# Patient Record
Sex: Male | Born: 1985 | Race: White | Hispanic: No | Marital: Single | Smoking: Current every day smoker
Health system: Southern US, Community
[De-identification: ages and names within clinical notes are randomized; demographics above are authoritative.]

## PROBLEM LIST (undated history)

## (undated) DIAGNOSIS — F419 Anxiety disorder, unspecified: Secondary | ICD-10-CM

## (undated) HISTORY — PX: TONSILLECTOMY: SUR1361

## (undated) HISTORY — DX: Anxiety disorder, unspecified: F41.9

---

## 2014-02-19 ENCOUNTER — Emergency Department: Payer: Self-pay | Admitting: Emergency Medicine

## 2014-02-19 LAB — CBC
HCT: 46.4 % (ref 40.0–52.0)
HGB: 15.5 g/dL (ref 13.0–18.0)
MCH: 29.3 pg (ref 26.0–34.0)
MCHC: 33.4 g/dL (ref 32.0–36.0)
MCV: 88 fL (ref 80–100)
Platelet: 228 10*3/uL (ref 150–440)
RBC: 5.29 10*6/uL (ref 4.40–5.90)
RDW: 13.4 % (ref 11.5–14.5)
WBC: 8.9 10*3/uL (ref 3.8–10.6)

## 2014-02-19 LAB — COMPREHENSIVE METABOLIC PANEL
ALT: 24 U/L
ANION GAP: 5 — AB (ref 7–16)
Albumin: 4.2 g/dL (ref 3.4–5.0)
Alkaline Phosphatase: 89 U/L
BUN: 18 mg/dL (ref 7–18)
Bilirubin,Total: 1.1 mg/dL — ABNORMAL HIGH (ref 0.2–1.0)
CREATININE: 1.23 mg/dL (ref 0.60–1.30)
Calcium, Total: 8.9 mg/dL (ref 8.5–10.1)
Chloride: 108 mmol/L — ABNORMAL HIGH (ref 98–107)
Co2: 27 mmol/L (ref 21–32)
EGFR (African American): >60
EGFR (Non-African Amer.): >60
Glucose: 102 mg/dL — ABNORMAL HIGH (ref 65–99)
Osmolality: 281 (ref 275–301)
Potassium: 3.8 mmol/L (ref 3.5–5.1)
SGOT(AST): 26 U/L (ref 15–37)
Sodium: 140 mmol/L (ref 136–145)
Total Protein: 7.3 g/dL (ref 6.4–8.2)

## 2014-02-19 LAB — URINALYSIS, COMPLETE
BILIRUBIN, UR: NEGATIVE
Bacteria: NONE SEEN
Blood: NEGATIVE
GLUCOSE, UR: NEGATIVE mg/dL (ref 0–75)
Leukocyte Esterase: NEGATIVE
NITRITE: NEGATIVE
Ph: 6 (ref 4.5–8.0)
Protein: 30
RBC,UR: 1 /HPF (ref 0–5)
Specific Gravity: 1.029 (ref 1.003–1.030)
Squamous Epithelial: <1
WBC UR: 1 /HPF (ref 0–5)
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
## 2019-10-26 ENCOUNTER — Ambulatory Visit (INDEPENDENT_AMBULATORY_CARE_PROVIDER_SITE_OTHER): Payer: 59 | Admitting: Physician Assistant

## 2019-10-26 ENCOUNTER — Encounter: Payer: Self-pay | Admitting: Physician Assistant

## 2019-10-26 ENCOUNTER — Other Ambulatory Visit: Payer: Self-pay

## 2019-10-26 VITALS — Ht 70.0 in

## 2019-10-26 DIAGNOSIS — F321 Major depressive disorder, single episode, moderate: Secondary | ICD-10-CM | POA: Diagnosis not present

## 2019-10-26 MED ORDER — CITALOPRAM HYDROBROMIDE 20 MG PO TABS: 20.0000 mg | ORAL_TABLET | Freq: Every day | ORAL | 1 refills | Status: DC

## 2019-10-26 NOTE — Patient Instructions (Signed)
Citalopram tablets What is this medicine? CITALOPRAM (sye TAL oh pram) is a medicine for depression. This medicine may be used for other purposes; ask your health care provider or pharmacist if you have questions. COMMON BRAND NAME(S): Celexa What should I tell my health care provider before I take this medicine? They need to know if you have any of these conditions:  bleeding disorders  bipolar disorder or a family history of bipolar disorder  glaucoma  heart disease  history of irregular heartbeat  kidney disease  liver disease  low levels of magnesium or potassium in the blood  receiving electroconvulsive therapy  seizures  suicidal thoughts, plans, or attempt; a previous suicide attempt by you or a family member  take medicines that treat or prevent blood clots  thyroid disease  an unusual or allergic reaction to citalopram, escitalopram, other medicines, foods, dyes, or preservatives  pregnant or trying to become pregnant  breast-feeding How should I use this medicine? Take this medicine by mouth with a glass of water. Follow the directions on the prescription label. You can take it with or without food. Take your medicine at regular intervals. Do not take your medicine more often than directed. Do not stop taking this medicine suddenly except upon the advice of your doctor. Stopping this medicine too quickly may cause serious side effects or your condition may worsen. A special MedGuide will be given to you by the pharmacist with each prescription and refill. Be sure to read this information carefully each time. Talk to your pediatrician regarding the use of this medicine in children. Special care may be needed. Patients over 60 years old may have a stronger reaction and need a smaller dose. Overdosage: If you think you have taken too much of this medicine contact a poison control center or emergency room at once. NOTE: This medicine is only for you. Do not share  this medicine with others. What if I miss a dose? If you miss a dose, take it as soon as you can. If it is almost time for your next dose, take only that dose. Do not take double or extra doses. What may interact with this medicine? Do not take this medicine with any of the following medications:  certain medicines for fungal infections like fluconazole, itraconazole, ketoconazole, posaconazole, voriconazole  cisapride  dronedarone  escitalopram  linezolid  MAOIs like Carbex, Eldepryl, Marplan, Nardil, and Parnate  methylene blue (injected into a vein)  pimozide  thioridazine This medicine may also interact with the following medications:  alcohol  amphetamines  aspirin and aspirin-like medicines  carbamazepine  certain medicines for depression, anxiety, or psychotic disturbances  certain medicines for infections like chloroquine, clarithromycin, erythromycin, furazolidone, isoniazid, pentamidine  certain medicines for migraine headaches like almotriptan, eletriptan, frovatriptan, naratriptan, rizatriptan, sumatriptan, zolmitriptan  certain medicines for sleep  certain medicines that treat or prevent blood clots like dalteparin, enoxaparin, warfarin  cimetidine  diuretics  dofetilide  fentanyl  lithium  methadone  metoprolol  NSAIDs, medicines for pain and inflammation, like ibuprofen or naproxen  omeprazole  other medicines that prolong the QT interval (cause an abnormal heart rhythm)  procarbazine  rasagiline  supplements like St. John's wort, kava kava, valerian  tramadol  tryptophan  ziprasidone This list may not describe all possible interactions. Give your health care provider a list of all the medicines, herbs, non-prescription drugs, or dietary supplements you use. Also tell them if you smoke, drink alcohol, or use illegal drugs. Some items may interact with   your medicine. What should I watch for while using this medicine? Tell your  doctor if your symptoms do not get better or if they get worse. Visit your doctor or health care professional for regular checks on your progress. Because it may take several weeks to see the full effects of this medicine, it is important to continue your treatment as prescribed by your doctor. Patients and their families should watch out for new or worsening thoughts of suicide or depression. Also watch out for sudden changes in feelings such as feeling anxious, agitated, panicky, irritable, hostile, aggressive, impulsive, severely restless, overly excited and hyperactive, or not being able to sleep. If this happens, especially at the beginning of treatment or after a change in dose, call your health care professional. You may get drowsy or dizzy. Do not drive, use machinery, or do anything that needs mental alertness until you know how this medicine affects you. Do not stand or sit up quickly, especially if you are an older patient. This reduces the risk of dizzy or fainting spells. Alcohol may interfere with the effect of this medicine. Avoid alcoholic drinks. Your mouth may get dry. Chewing sugarless gum or sucking hard candy, and drinking plenty of water will help. Contact your doctor if the problem does not go away or is severe. What side effects may I notice from receiving this medicine? Side effects that you should report to your doctor or health care professional as soon as possible:  allergic reactions like skin rash, itching or hives, swelling of the face, lips, or tongue  anxious  black, tarry stools  breathing problems  changes in vision  chest pain  confusion  elevated mood, decreased need for sleep, racing thoughts, impulsive behavior  eye pain  fast, irregular heartbeat  feeling faint or lightheaded, falls  feeling agitated, angry, or irritable  hallucination, loss of contact with reality  loss of balance or coordination  loss of memory  painful or prolonged  erections  restlessness, pacing, inability to keep still  seizures  stiff muscles  suicidal thoughts or other mood changes  trouble sleeping  unusual bleeding or bruising  unusually weak or tired  vomiting Side effects that usually do not require medical attention (report to your doctor or health care professional if they continue or are bothersome):  change in appetite or weight  change in sex drive or performance  dizziness  headache  increased sweating  indigestion, nausea  tremors This list may not describe all possible side effects. Call your doctor for medical advice about side effects. You may report side effects to FDA at 1-800-FDA-1088. Where should I keep my medicine? Keep out of reach of children. Store at room temperature between 15 and 30 degrees C (59 and 86 degrees F). Throw away any unused medicine after the expiration date. NOTE: This sheet is a summary. It may not cover all possible information. If you have questions about this medicine, talk to your doctor, pharmacist, or health care provider.  2020 Elsevier/Gold Standard (2018-05-30 09:05:36)  

## 2019-10-30 ENCOUNTER — Encounter: Payer: Self-pay | Admitting: Physician Assistant

## 2019-11-23 ENCOUNTER — Encounter: Payer: Self-pay | Admitting: Physician Assistant

## 2019-11-23 ENCOUNTER — Ambulatory Visit: Payer: Self-pay | Admitting: Physician Assistant

## 2019-11-23 DIAGNOSIS — F321 Major depressive disorder, single episode, moderate: Secondary | ICD-10-CM

## 2019-11-23 MED ORDER — CITALOPRAM HYDROBROMIDE 20 MG PO TABS: 20.0000 mg | ORAL_TABLET | Freq: Every day | ORAL | 1 refills | Status: DC

## 2020-04-22 ENCOUNTER — Encounter: Payer: Self-pay | Admitting: *Deleted

## 2020-04-22 ENCOUNTER — Emergency Department: Payer: Self-pay

## 2020-04-22 ENCOUNTER — Other Ambulatory Visit: Payer: Self-pay

## 2020-04-22 ENCOUNTER — Emergency Department
Admission: EM | Admit: 2020-04-22 | Discharge: 2020-04-22 | Disposition: A | Discharge status: Home / Self Care | Payer: Self-pay | Attending: Emergency Medicine | Admitting: Emergency Medicine

## 2020-04-22 DIAGNOSIS — X501XXA Overexertion from prolonged static or awkward postures, initial encounter: Secondary | ICD-10-CM | POA: Insufficient documentation

## 2020-04-22 DIAGNOSIS — F17210 Nicotine dependence, cigarettes, uncomplicated: Secondary | ICD-10-CM | POA: Insufficient documentation

## 2020-04-22 DIAGNOSIS — Z79899 Other long term (current) drug therapy: Secondary | ICD-10-CM | POA: Insufficient documentation

## 2020-04-22 DIAGNOSIS — S8992XA Unspecified injury of left lower leg, initial encounter: Secondary | ICD-10-CM | POA: Insufficient documentation

## 2020-04-22 NOTE — Discharge Instructions (Addendum)
There is no fracture on your knee x-ray.  Please use knee brace and ice and elevate knee.  Continue using crutches.  Please call orthopedics tomorrow for a follow-up appointment.

## 2020-04-22 NOTE — ED Provider Notes (Signed)
[facility] Emergency Department Provider Note  ____________________________________________  Time seen: Approximately 5:44 PM  I have reviewed the triage vital signs and the nursing notes.   HISTORY  Chief Complaint Knee Pain    HPI Danny Dixon is a 34 y.o. male that presents to the emergency department for evaluation of left knee pain after an injury 2 days ago.  Patient states that he stepped wrong and twisted his knee and felt a pop.  He is having pain to the inside of his knee currently.  He is able to walk but it is mildly painful.  He has been using crutches and taking ibuprofen.  No other injuries.   No past medical history on file.  Patient Active Problem List   Diagnosis Date Noted  . Depression, major, single episode, moderate (HCC) 10/26/2019    Past Surgical History:  Procedure Laterality Date  . TONSILLECTOMY      Prior to Admission medications   Medication Sig Start Date End Date Taking? Authorizing Provider  citalopram (CELEXA) 20 MG tablet Take 1 tablet (20 mg total) by mouth daily. 11/23/19   Margaretann Loveless, PA-C    Allergies Patient has no known allergies.  Family History  Problem Relation Age of Onset  . Multiple sclerosis Mother   . Healthy Father   . Healthy Sister   . Autism Son   . ADD / ADHD Son   . Diabetes Maternal Grandfather   . Hypertension Maternal Grandfather     Social History Social History   Tobacco Use  . Smoking status: Current Every Day Smoker    Packs/day: 0.50    Years: 10.00    Pack years: 5.00    Types: Cigarettes  . Smokeless tobacco: Never Used  Vaping Use  . Vaping Use: Never used  Substance Use Topics  . Alcohol use: Yes  . Drug use: Never     Review of Systems  Cardiovascular: No chest pain. Respiratory: No SOB. Gastrointestinal: No abdominal pain.  No nausea, no vomiting.  Musculoskeletal: Positive for knee pain. Skin: Negative for rash, abrasions, lacerations,  ecchymosis. Neurological: Negative for headaches  ____________________________________________   PHYSICAL EXAM:  VITAL SIGNS: ED Triage Vitals  Enc Vitals Group     BP 04/22/20 1547 (!) 152/90     Pulse Rate 04/22/20 1547 95     Resp 04/22/20 1547 18     Temp 04/22/20 1547 98.9 F (37.2 C)     Temp Source 04/22/20 1547 Oral     SpO2 04/22/20 1547 96 %     Weight 04/22/20 1545 260 lb (117.9 kg)     Height 04/22/20 1545 5\' 11"  (1.803 m)     Head Circumference --      Peak Flow --      Pain Score 04/22/20 1545 7     Pain Loc --      Pain Edu? --      Excl. in GC? --      Constitutional: Alert and oriented. Well appearing and in no acute distress. Eyes: Conjunctivae are normal. PERRL. EOMI. Head: Atraumatic. ENT:      Ears:      Nose: No congestion/rhinnorhea.      Mouth/Throat: Mucous membranes are moist.  Neck: No stridor. Cardiovascular: Normal rate, regular rhythm.  Good peripheral circulation. Respiratory: Normal respiratory effort without tachypnea or retractions. Lungs CTAB. Good air entry to the bases with no decreased or absent breath sounds. Musculoskeletal: Full range of motion  to all extremities. No gross deformities appreciated.  Mild tenderness to palpation to left medial knee.  No effusion noted. Negative anterior drawer, posterior drawer, valgus, varus, patella apprehension. Positive apley grind. Neurologic:  Normal speech and language. No gross focal neurologic deficits are appreciated.  Skin:  Skin is warm, dry and intact. No rash noted. Psychiatric: Mood and affect are normal. Speech and behavior are normal. Patient exhibits appropriate insight and judgement.   ____________________________________________   LABS (all labs ordered are listed, but only abnormal results are displayed)  Labs Reviewed - No data to display ____________________________________________  EKG   ____________________________________________  RADIOLOGY Lexine Baton,  personally viewed and evaluated these images (plain radiographs) as part of my medical decision making, as well as reviewing the written report by the radiologist.  DG Knee Complete 4 Views Left  Result Date: 04/22/2020 CLINICAL DATA:  Left knee pain and swelling, twisting injury 2 days ago EXAM: LEFT KNEE - COMPLETE 4+ VIEW COMPARISON:  None. FINDINGS: Frontal, bilateral oblique, lateral views of the left knee are obtained. No fracture, subluxation, or dislocation. Joint spaces are well preserved. No joint effusion. IMPRESSION: 1. Unremarkable left knee. Electronically Signed   By: Sharlet Salina M.D.   On: 04/22/2020 16:43    ____________________________________________    PROCEDURES  Procedure(s) performed:    Procedures    Medications - No data to display   ____________________________________________   INITIAL IMPRESSION / ASSESSMENT AND PLAN / ED COURSE  Pertinent labs & imaging results that were available during my care of the patient were reviewed by me and considered in my medical decision making (see chart for details).  Review of the [place] CSRS was performed in accordance of the [facility] prior to dispensing any controlled drugs.   Patient presented to emergency department for evaluation of knee pain.  Vital signs and exam are reassuring.  X-ray negative for acute bony abnormalities.  Knee brace was given.  Patient has his own crutches that he will continue using.  Patient will be discharged home with prescriptions for Motrin. Patient is to follow up with orthopedics as directed. Patient is given ED precautions to return to the ED for any worsening or new symptoms.  [name] was evaluated in Emergency Department on 04/22/2020 for the symptoms described in the history of present illness. He was evaluated in the context of the global COVID-19 pandemic, which necessitated consideration that the patient might be at risk for infection with the SARS-CoV-2 virus that causes  COVID-19. Institutional protocols and algorithms that pertain to the evaluation of patients at risk for COVID-19 are in a state of rapid change based on information released by regulatory bodies including the CDC and federal and state organizations. These policies and algorithms were followed during the patient's care in the ED.   ____________________________________________  FINAL CLINICAL IMPRESSION(S) / ED DIAGNOSES  Final diagnoses:  Injury of left knee, initial encounter      NEW MEDICATIONS STARTED DURING THIS VISIT:  ED Discharge Orders    None          This chart was dictated using voice recognition software/Dragon. Despite best efforts to proofread, errors can occur which can change the meaning. Any change was purely unintentional.    Enid Derry, PA-C 04/22/20 1944    Sharman Cheek, MD 04/22/20 [phone]

## 2020-04-22 NOTE — ED Triage Notes (Signed)
pt ambulatory with crutches.  Pt has left knee pain.  Pt twisted left knee and felt a pop.  Swelling noted.  Pt alert.

## 2021-02-16 ENCOUNTER — Emergency Department
Admission: EM | Admit: 2021-02-16 | Discharge: 2021-02-16 | Disposition: A | Discharge status: Home / Self Care | Payer: Self-pay | Attending: Emergency Medicine | Admitting: Emergency Medicine

## 2021-02-16 ENCOUNTER — Encounter: Payer: Self-pay | Admitting: Emergency Medicine

## 2021-02-16 ENCOUNTER — Emergency Department: Payer: Self-pay

## 2021-02-16 ENCOUNTER — Other Ambulatory Visit: Payer: Self-pay

## 2021-02-16 DIAGNOSIS — F17210 Nicotine dependence, cigarettes, uncomplicated: Secondary | ICD-10-CM | POA: Insufficient documentation

## 2021-02-16 DIAGNOSIS — M25562 Pain in left knee: Secondary | ICD-10-CM | POA: Insufficient documentation

## 2021-02-16 MED ORDER — OXYCODONE HCL 5 MG PO TABS: 5.0000 mg | ORAL_TABLET | Freq: Four times a day (QID) | ORAL | 0 refills | Status: AC | PRN

## 2021-02-16 NOTE — ED Notes (Signed)
Pt NAD, a/ox4. Pt verbalizes understanding of all DC and f/u instructions. All questions answered. Pt wheeled to lobby by spouse.

## 2021-02-16 NOTE — ED Provider Notes (Signed)
[facility] Emergency Department Provider Note  ____________________________________________   Event Date/Time   First MD Initiated Contact with Patient 02/16/21 1823     (approximate)  I have reviewed the triage vital signs and the nursing notes.   HISTORY  Chief Complaint Knee Injury and Knee Pain    HPI Danny Dixon is a 35 y.o. male otherwise healthy although does have a history of a left meniscus tear who comes in with concern for knee pain.  Patient reports that yesterday he was standing and he went to move and he felt a twist in his left knee that felt a pop.  Denied seeing any deformity but noticed that he had some increased swelling and pain, constant, nothing makes it better, nothing makes it worse.  He still able to range the knee although slightly limited due to the swelling is still able to ambulate although with a limp.  Denies falling to the ground or injuring anything else          History reviewed. No pertinent past medical history.  Patient Active Problem List   Diagnosis Date Noted   Depression, major, single episode, moderate (HCC) 10/26/2019    Past Surgical History:  Procedure Laterality Date   TONSILLECTOMY      Prior to Admission medications   Medication Sig Start Date End Date Taking? Authorizing Provider  citalopram (CELEXA) 20 MG tablet Take 1 tablet (20 mg total) by mouth daily. 11/23/19   Margaretann Loveless, PA-C    Allergies Patient has no known allergies.  Family History  Problem Relation Age of Onset   Multiple sclerosis Mother    Healthy Father    Healthy Sister    Autism Son    ADD / ADHD Son    Diabetes Maternal Grandfather    Hypertension Maternal Grandfather     Social History Social History   Tobacco Use   Smoking status: Every Day    Packs/day: 0.50    Years: 10.00    Pack years: 5.00    Types: Cigarettes   Smokeless tobacco: Never  Vaping Use   Vaping Use: Never used   Substance Use Topics   Alcohol use: Yes   Drug use: Never      Review of Systems Constitutional: No fever/chills Eyes: No visual changes. ENT: No sore throat. Cardiovascular: Denies chest pain. Respiratory: Denies shortness of breath. Gastrointestinal: No abdominal pain.  No nausea, no vomiting.  No diarrhea.  No constipation. Genitourinary: Negative for dysuria. Musculoskeletal: Knee pain Skin: Negative for rash. Neurological: Negative for headaches, focal weakness or numbness. All other ROS negative ____________________________________________   PHYSICAL EXAM:  VITAL SIGNS: ED Triage Vitals  Enc Vitals Group     BP 02/16/21 1651 (!) 144/91     Pulse Rate 02/16/21 1651 84     Resp 02/16/21 1651 18     Temp 02/16/21 1651 98.5 F (36.9 C)     Temp Source 02/16/21 1651 Oral     SpO2 02/16/21 1651 96 %     Weight 02/16/21 1521 245 lb (111.1 kg)     Height 02/16/21 1521 5\' 10"  (1.778 m)     Head Circumference --      Peak Flow --      Pain Score 02/16/21 1521 7     Pain Loc --      Pain Edu? --      Excl. in [place]? --     Constitutional: Alert and oriented. Well  appearing and in no acute distress. Eyes: Conjunctivae are normal. EOMI. Head: Atraumatic. Nose: No congestion/rhinnorhea. Mouth/Throat: Mucous membranes are moist.   Neck: No stridor. Trachea Midline. FROM Cardiovascular: Normal rate, regular rhythm. Grossly normal heart sounds.  Good peripheral circulation. Respiratory: Normal respiratory effort.  No retractions. Lungs CTAB. Gastrointestinal: Soft and nontender. No distention. No abdominal bruits.  Musculoskeletal: Left knee still with some swelling with some tenderness on the knee around the distal femur area but still able to range the knee and able to bear some weight.  Distal pulses intact.  2+.  No redness, warmth. Neurologic:  Normal speech and language. No gross focal neurologic deficits are appreciated.  Skin:  Skin is warm, dry and intact. No rash  noted. Psychiatric: Mood and affect are normal. Speech and behavior are normal. GU: Deferred   ____________________________________________   RADIOLOGY Vela Prose, personally viewed and evaluated these images (plain radiographs) as part of my medical decision making, as well as reviewing the written report by the radiologist.  ED MD interpretation: No obvious fracture  Official radiology report(s): DG Knee Complete 4 Views Left  Result Date: 02/16/2021 CLINICAL DATA:  L knee injury EXAM: LEFT KNEE - COMPLETE 4+ VIEW COMPARISON:  April 23, 2019 FINDINGS: No acute fracture or dislocation. Joint spaces and alignment are maintained. No area of erosion or osseous destruction. No unexpected radiopaque foreign body. Soft tissue edema anterior to the patella. IMPRESSION: No acute fracture. Electronically Signed   By: Meda Klinefelter M.D.   On: 02/16/2021 16:10    ____________________________________________   PROCEDURES  Procedure(s) performed (including Critical Care):  Procedures   ____________________________________________   INITIAL IMPRESSION / ASSESSMENT AND PLAN / ED COURSE  Danny Dixon was evaluated in Emergency Department on 02/16/2021 for the symptoms described in the history of present illness. He was evaluated in the context of the global COVID-19 pandemic, which necessitated consideration that the patient might be at risk for infection with the SARS-CoV-2 virus that causes COVID-19. Institutional protocols and algorithms that pertain to the evaluation of patients at risk for COVID-19 are in a state of rapid change based on information released by regulatory bodies including the CDC and federal and state organizations. These policies and algorithms were followed during the patient's care in the ED.    X-ray ordered to evaluate for dislocation, fracture.  On my examination there is no obvious dislocation and does not sound like a knee dislocation.  Is got 2+  distal pulse I do not think he has any popliteal injury specialist is already almost 24 hours out from injury.  I suspect it is sometimes a ligament issue.  We discussed conservative management and patient already has a hinged brace to wear and crutches.  We will do Tylenol, ibuprofen and a few doses of oxycodone and given orthopedic number for follow-up         ____________________________________________   FINAL CLINICAL IMPRESSION(S) / ED DIAGNOSES   Final diagnoses:  Acute pain of left knee      MEDICATIONS GIVEN DURING THIS VISIT:  Medications - No data to display   ED Discharge Orders     None        Note:  This document was prepared using Dragon voice recognition software and may include unintentional dictation errors.    Concha Se, MD 02/16/21 [phone]

## 2021-02-16 NOTE — Discharge Instructions (Addendum)
Tylenol 1 g every 8 hours, ibuprofen 600 every 6 hours with food.  Take the oxycodone for breakthrough pain.  Do not drive or work while on this.  Use the crutches and brace and call orthopedics on Monday to get follow-up  Return to the ER for worsening pain, fevers, redness or any other concerns  Take oxycodone as prescribed. Do not drink alcohol, drive or participate in any other potentially dangerous activities while taking this medication as it may make you sleepy. Do not take this medication with any other sedating medications, either prescription or over-the-counter. If you were prescribed Percocet or Vicodin, do not take these with acetaminophen (Tylenol) as it is already contained within these medications.  This medication is an opiate (or narcotic) pain medication and can be habit forming. Use it as little as possible to achieve adequate pain control. Do not use or use it with extreme caution if you have a history of opiate abuse or dependence. If you are on a pain contract with your primary care doctor or a pain specialist, be sure to let them know you were prescribed this medication today from the Emergency Department. This medication is intended for your use only - do not give any to anyone else and keep it in a secure place where nobody else, especially children, have access to it.     No acute fracture or dislocation. Joint spaces and alignment are maintained. No area of erosion or osseous destruction. No unexpected radiopaque foreign body. Soft tissue edema anterior to the patella.   IMPRESSION: No acute fracture.

## 2021-02-16 NOTE — ED Triage Notes (Signed)
Pt reports was standing last pm twisted a little and felt a pop in his left knee.

## 2021-10-18 ENCOUNTER — Ambulatory Visit
Admission: EM | Admit: 2021-10-18 | Discharge: 2021-10-18 | Disposition: A | Discharge status: Home / Self Care | Payer: Managed Care, Other (non HMO) | Attending: Physician Assistant | Admitting: Physician Assistant

## 2021-10-18 DIAGNOSIS — R051 Acute cough: Secondary | ICD-10-CM | POA: Diagnosis not present

## 2021-10-18 DIAGNOSIS — J019 Acute sinusitis, unspecified: Secondary | ICD-10-CM | POA: Diagnosis present

## 2021-10-18 DIAGNOSIS — R0981 Nasal congestion: Secondary | ICD-10-CM | POA: Insufficient documentation

## 2021-10-18 LAB — GROUP A STREP BY PCR: Group A Strep by PCR: NOT DETECTED

## 2021-10-18 MED ORDER — PSEUDOEPH-BROMPHEN-DM 30-2-10 MG/5ML PO SYRP: 10.0000 mL | ORAL_SOLUTION | Freq: Four times a day (QID) | ORAL | 0 refills | Status: AC | PRN

## 2021-10-18 MED ORDER — AMOXICILLIN-POT CLAVULANATE 875-125 MG PO TABS: 1.0000 | ORAL_TABLET | Freq: Two times a day (BID) | ORAL | 0 refills | Status: AC

## 2021-10-18 NOTE — Discharge Instructions (Signed)
-  Negative strep ?-Symptoms consistent with a sinus infection ?-Antibiotics were sent to pharmacy as well as cough medicine.  Increase rest and fluids. ?- Begin using nasal saline and Flonase. ?

## 2021-10-18 NOTE — ED Triage Notes (Signed)
Pt c/o Body aches, nasal congestion, cough with brown phlegm, sore throat x1-2weeks. ?

## 2021-10-18 NOTE — ED Provider Notes (Signed)
?[facility] ? ? ? ?CSN: [id number] ?Arrival date & time: 10/18/21  1214 ? ? ?  ? ?History   ?Chief Complaint ?Chief Complaint  ?Patient presents with  ? Cough  ? Nasal Congestion  ? ? ?HPI ?Danny Dixon is a 36 y.o. male presenting for 1.5-week history of body aches, fatigue, nasal congestion, sinus pressure and productive cough.  Cough is productive of brownish-yellow sputum.  Also report sore throat and postnasal drainage.  Patient says symptoms are getting any better.  His son is sick with similar symptoms and is wife has also been sick with similar symptoms for the past 3 weeks.  He is concerned about a sinus infection.  Has tried over-the-counter antihistamines, decongestants and nasal sprays without improvement in symptoms.  No other complaints. ? ?HPI ? ?History reviewed. No pertinent past medical history. ? ?Patient Active Problem List  ? Diagnosis Date Noted  ? Depression, major, single episode, moderate ([place]) 10/26/2019  ? ? ?Past Surgical History:  ?Procedure Laterality Date  ? TONSILLECTOMY    ? ? ? ? ? ?Home Medications   ? ?Prior to Admission medications   ?Medication Sig Start Date End Date Taking? Authorizing Provider  ?amoxicillin-clavulanate (AUGMENTIN) 875-125 MG tablet Take 1 tablet by mouth every 12 (twelve) hours for 7 days. 10/18/21 10/25/21 Yes Laurene Footman B, PA-C  ?brompheniramine-pseudoephedrine-DM 30-2-10 MG/5ML syrup Take 10 mLs by mouth 4 (four) times daily as needed for up to 7 days. 10/18/21 10/25/21 Yes Danton Clap, PA-C  ?citalopram (CELEXA) 20 MG tablet Take 1 tablet (20 mg total) by mouth daily. 11/23/19   Mar Daring, PA-C  ? ? ?Family History ?Family History  ?Problem Relation Age of Onset  ? Multiple sclerosis Mother   ? Healthy Father   ? Healthy Sister   ? Autism Son   ? ADD / ADHD Son   ? Diabetes Maternal Grandfather   ? Hypertension Maternal Grandfather   ? ? ?Social History ?Social History  ? ?Tobacco Use  ? Smoking status: Every Day  ?   Packs/day: 0.50  ?  Years: 10.00  ?  Pack years: 5.00  ?  Types: Cigarettes  ? Smokeless tobacco: Current  ?Vaping Use  ? Vaping Use: Every day  ?Substance Use Topics  ? Alcohol use: Yes  ? Drug use: Never  ? ? ? ?Allergies   ?Patient has no known allergies. ? ? ?Review of Systems ?Review of Systems  ?Constitutional:  Positive for fatigue. Negative for fever.  ?HENT:  Positive for congestion, rhinorrhea, sinus pressure and sore throat. Negative for ear pain.   ?Respiratory:  Positive for cough. Negative for shortness of breath.   ?Gastrointestinal:  Negative for abdominal pain, diarrhea, nausea and vomiting.  ?Musculoskeletal:  Positive for myalgias.  ?Neurological:  Negative for weakness, light-headedness and headaches.  ?Hematological:  Negative for adenopathy.  ? ? ?Physical Exam ?Triage Vital Signs ?ED Triage Vitals  ?Enc Vitals Group  ?   BP 10/18/21 1241 (!) 151/68  ?   Pulse Rate 10/18/21 1241 (!) 111  ?   Resp 10/18/21 1241 18  ?   Temp 10/18/21 1241 98.5 ?F (36.9 ?C)  ?   Temp Source 10/18/21 1241 Oral  ?   SpO2 10/18/21 1241 97 %  ?   Weight 10/18/21 1239 299 lb 14.4 oz (136 kg)  ?   Height 10/18/21 1239 5\' 11"  (1.803 m)  ?   Head Circumference --   ?   Peak Flow --   ?  Pain Score 10/18/21 1239 3  ?   Pain Loc --   ?   Pain Edu? --   ?   Excl. in [place]? --   ? ?No data found. ? ?Updated Vital Signs ?BP (!) 151/68 (BP Location: Right Arm)   Pulse (!) 111   Temp 98.5 ?F (36.9 ?C) (Oral)   Resp 18   Ht 5\' 11"  (1.803 m)   Wt 299 lb 14.4 oz (136 kg)   SpO2 97%   BMI 41.83 kg/m?  ? ?   ? ?Physical Exam ?Vitals and nursing note reviewed.  ?Constitutional:   ?   General: He is not in acute distress. ?   Appearance: Normal appearance. He is well-developed. He is ill-appearing.  ?HENT:  ?   Head: Normocephalic and atraumatic.  ?   Right Ear: Tympanic membrane, ear canal and external ear normal.  ?   Left Ear: Tympanic membrane, ear canal and external ear normal.  ?   Nose: Congestion present.  ?    Mouth/Throat:  ?   Mouth: Mucous membranes are moist.  ?   Pharynx: Oropharynx is clear. Posterior oropharyngeal erythema present.  ?Eyes:  ?   General: No scleral icterus. ?   Conjunctiva/sclera: Conjunctivae normal.  ?Cardiovascular:  ?   Rate and Rhythm: Regular rhythm. Tachycardia present.  ?   Heart sounds: Normal heart sounds.  ?Pulmonary:  ?   Effort: Pulmonary effort is normal. No respiratory distress.  ?   Breath sounds: Normal breath sounds.  ?Musculoskeletal:  ?   Cervical back: Neck supple.  ?Lymphadenopathy:  ?   Cervical: Cervical adenopathy present.  ?Skin: ?   General: Skin is warm and dry.  ?   Capillary Refill: Capillary refill takes less than 2 seconds.  ?Neurological:  ?   General: No focal deficit present.  ?   Mental Status: He is alert. Mental status is at baseline.  ?   Motor: No weakness.  ?   Gait: Gait normal.  ?Psychiatric:     ?   Mood and Affect: Mood normal.     ?   Behavior: Behavior normal.     ?   Thought Content: Thought content normal.  ? ? ? ?UC Treatments / Results  ?Labs ?(all labs ordered are listed, but only abnormal results are displayed) ?Labs Reviewed  ?GROUP A STREP BY PCR  ? ? ?EKG ? ? ?Radiology ?No results found. ? ?Procedures ?Procedures (including critical care time) ? ?Medications Ordered in UC ?Medications - No data to display ? ?Initial Impression / Assessment and Plan / UC Course  ?I have reviewed the triage vital signs and the nursing notes. ? ?Pertinent labs & imaging results that were available during my care of the patient were reviewed by me and considered in my medical decision making (see chart for details). ? ?36 year old male presenting for 1.5-week history of nasal congestion, sinus pressure and postnasal drainage and sore throat as well as cough productive of brownish-yellow sputum.  No improvement in his symptoms despite taking multiple over-the-counter medications.  His wife and son have similar symptoms.  No one has tested for COVID-19. ? ?Patient  is afebrile.  He is ill-appearing but nontoxic.  Nasal congestion noted on exam as well as erythema posterior pharynx with tender and enlarged bilateral anterior cervical lymph nodes.  Chest clear to auscultation. ? ?PCR strep test performed.  Negative.  Patient's son tested positive for strep today.  Patient is complaining of a sore throat  but he is most bothered by his nasal congestion and sinus pressure for the past week and a half.  Since he had no improvement in symptoms and has strep exposure, will cover him at this time with antibiotics for suspected bacterial sinusitis.  Sent Augmentin to pharmacy as well as Bromfed-DM provided with a work note.  Supportive care.  Follow-up as needed. ? ? ?Final Clinical Impressions(s) / UC Diagnoses  ? ?Final diagnoses:  ?Acute sinusitis, recurrence not specified, unspecified location  ?Nasal congestion  ?Acute cough  ? ? ? ?Discharge Instructions   ? ?  ?-Negative strep ?-Symptoms consistent with a sinus infection ?-Antibiotics were sent to pharmacy as well as cough medicine.  Increase rest and fluids. ?- Begin using nasal saline and Flonase. ? ? ? ? ?ED Prescriptions   ? ? Medication Sig Dispense Auth. Provider  ? brompheniramine-pseudoephedrine-DM 30-2-10 MG/5ML syrup Take 10 mLs by mouth 4 (four) times daily as needed for up to 7 days. 150 mL Laurene Footman B, PA-C  ? amoxicillin-clavulanate (AUGMENTIN) 875-125 MG tablet Take 1 tablet by mouth every 12 (twelve) hours for 7 days. 14 tablet Danton Clap, PA-C  ? ?  ? ?PDMP not reviewed this encounter. ?  ?Danton Clap, PA-C ?10/18/21 1337 ? ?

## 2022-01-04 IMAGING — CR DG KNEE COMPLETE 4+V*L*
1 series · 4 of 4 positions shown · non-contrast
Comparison: April 23, 2019

CLINICAL DATA: L knee injury

EXAM:
LEFT KNEE - COMPLETE 4+ VIEW

[Series 1: dg knee complete 4 views left · 0.14mm/px · 4 of 4 slices shown]
[im 1/4]
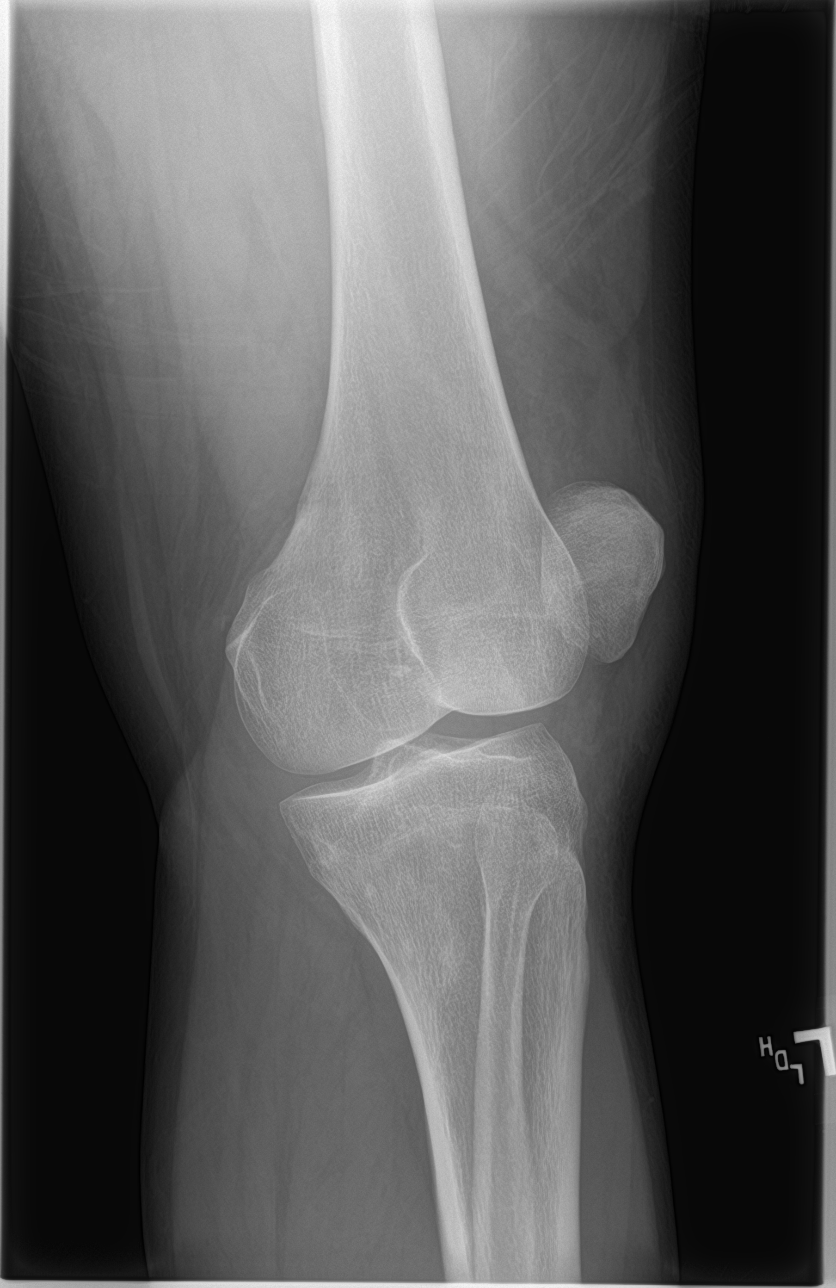
[im 2/4]
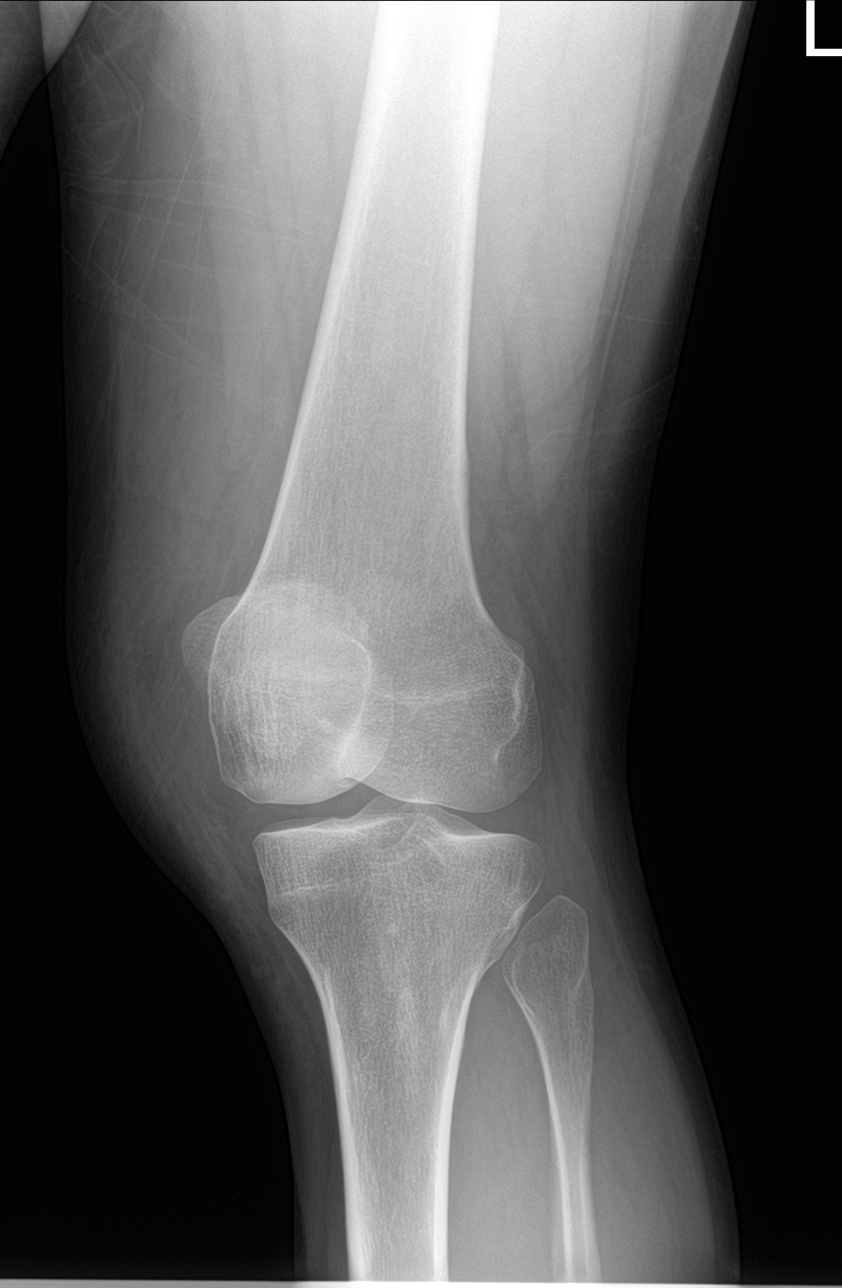
[im 3/4]
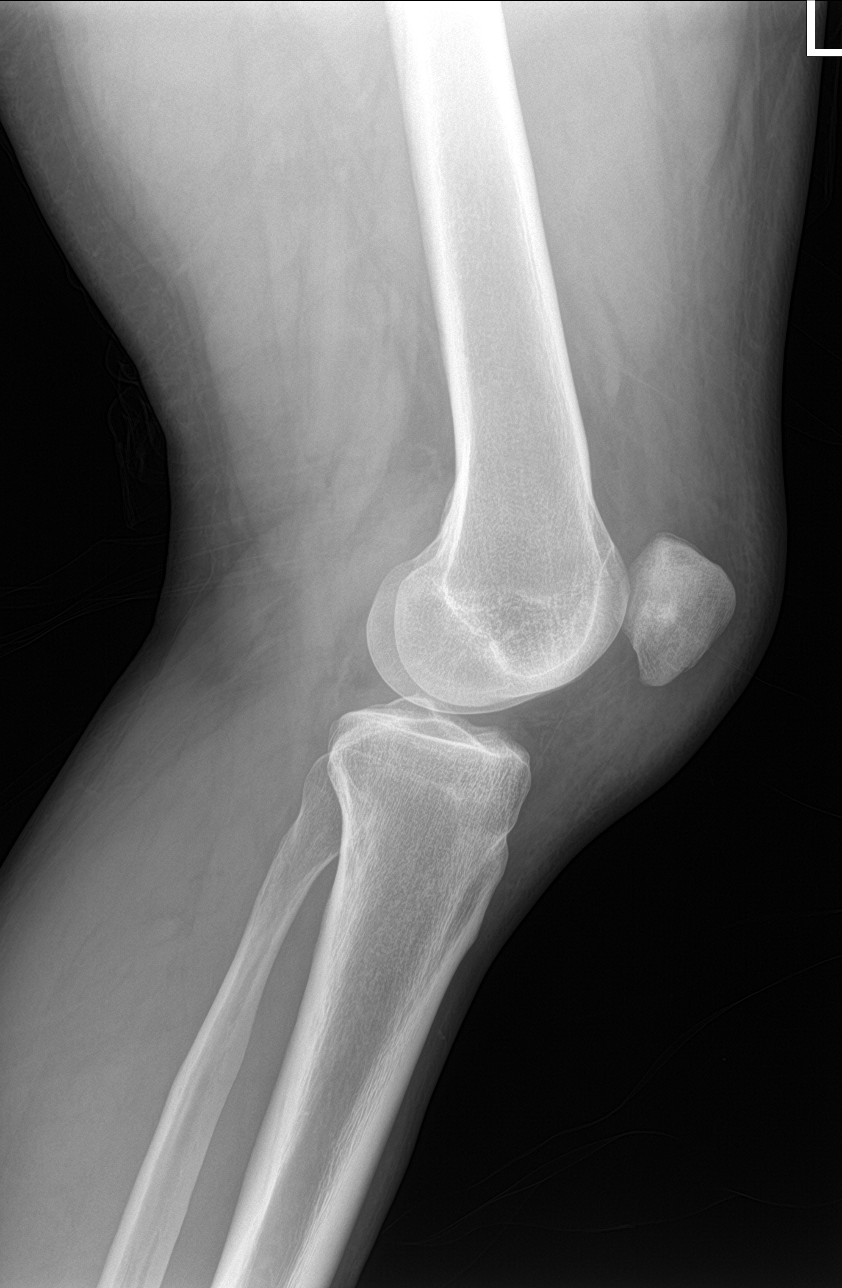
[im 4/4]
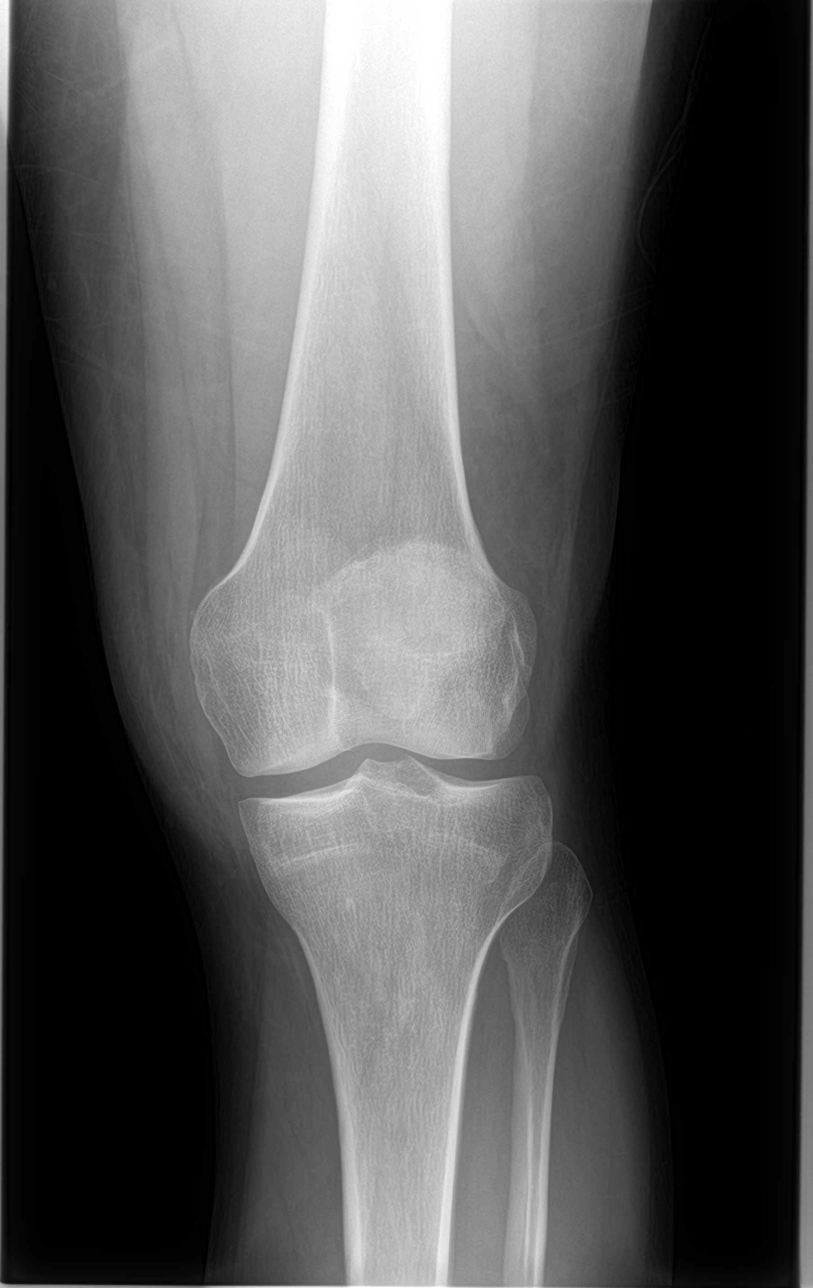

[4 of 4 positions shown; findings below may reference images not displayed]

FINDINGS: No acute fracture or dislocation. Joint spaces and alignment are
maintained. No area of erosion or osseous destruction. No unexpected
radiopaque foreign body. Soft tissue edema anterior to the patella.
IMPRESSION: No acute fracture.

## 2022-06-19 ENCOUNTER — Emergency Department
Admission: EM | Admit: 2022-06-19 | Discharge: 2022-06-19 | Discharge status: Against Medical Advice | Payer: Managed Care, Other (non HMO) | Attending: Emergency Medicine | Admitting: Emergency Medicine

## 2022-06-19 ENCOUNTER — Emergency Department: Payer: Managed Care, Other (non HMO)

## 2022-06-19 ENCOUNTER — Other Ambulatory Visit: Payer: Self-pay

## 2022-06-19 DIAGNOSIS — Z5321 Procedure and treatment not carried out due to patient leaving prior to being seen by health care provider: Secondary | ICD-10-CM | POA: Diagnosis not present

## 2022-06-19 DIAGNOSIS — M25562 Pain in left knee: Secondary | ICD-10-CM | POA: Insufficient documentation

## 2022-06-19 NOTE — ED Triage Notes (Signed)
Pt via POV by [facility] from home c/o left knee pain. Pt states hx left meniscus tear last year. No recent injuries. States walking today with left knee giving out and worsening pain. Currently rates pain 0/10 at rest. Pain worsens with weight bearing.

## 2023-10-11 ENCOUNTER — Ambulatory Visit: Admitting: Podiatry

## 2023-10-18 ENCOUNTER — Ambulatory Visit (INDEPENDENT_AMBULATORY_CARE_PROVIDER_SITE_OTHER): Admitting: Podiatry

## 2023-10-18 DIAGNOSIS — Z91199 Patient's noncompliance with other medical treatment and regimen due to unspecified reason: Secondary | ICD-10-CM

## 2023-10-19 NOTE — Progress Notes (Signed)
Patient was no-show for appointment todayPatient was no-show for appointment today

## 2024-02-07 ENCOUNTER — Emergency Department

## 2024-02-07 ENCOUNTER — Emergency Department
Admission: EM | Admit: 2024-02-07 | Discharge: 2024-02-07 | Disposition: A | Discharge status: Home / Self Care | Attending: Emergency Medicine | Admitting: Emergency Medicine

## 2024-02-07 ENCOUNTER — Other Ambulatory Visit: Payer: Self-pay

## 2024-02-07 DIAGNOSIS — R0789 Other chest pain: Secondary | ICD-10-CM | POA: Insufficient documentation

## 2024-02-07 DIAGNOSIS — R519 Headache, unspecified: Secondary | ICD-10-CM | POA: Diagnosis not present

## 2024-02-07 DIAGNOSIS — R079 Chest pain, unspecified: Secondary | ICD-10-CM

## 2024-02-07 LAB — BASIC METABOLIC PANEL WITH GFR
Anion gap: 11 (ref 5–15)
BUN: 11 mg/dL (ref 6–20)
CO2: 22 mmol/L (ref 22–32)
Calcium: 8.8 mg/dL — ABNORMAL LOW (ref 8.9–10.3)
Chloride: 106 mmol/L (ref 98–111)
Creatinine, Ser: 0.97 mg/dL (ref 0.61–1.24)
GFR, Estimated: >60 mL/min (ref >60)
Glucose, Bld: 102 mg/dL — ABNORMAL HIGH (ref 70–99)
Potassium: 4.5 mmol/L (ref 3.5–5.1)
Sodium: 139 mmol/L (ref 135–145)

## 2024-02-07 LAB — CBC
HCT: 47.4 % (ref 39.0–52.0)
Hemoglobin: 16.7 g/dL (ref 13.0–17.0)
MCH: 31 pg (ref 26.0–34.0)
MCHC: 35.2 g/dL (ref 30.0–36.0)
MCV: 87.9 fL (ref 80.0–100.0)
Platelets: 261 K/uL (ref 150–400)
RBC: 5.39 MIL/uL (ref 4.22–5.81)
RDW: 13 % (ref 11.5–15.5)
WBC: 6.2 K/uL (ref 4.0–10.5)
nRBC: 0 % (ref 0.0–0.2)

## 2024-02-07 LAB — HEPATIC FUNCTION PANEL
ALT: 39 U/L (ref 0–44)
AST: 27 U/L (ref 15–41)
Albumin: 4 g/dL (ref 3.5–5.0)
Alkaline Phosphatase: 79 U/L (ref 38–126)
Bilirubin, Direct: 0.1 mg/dL (ref 0.0–0.2)
Indirect Bilirubin: 0.4 mg/dL (ref 0.3–0.9)
Total Bilirubin: 0.5 mg/dL (ref 0.0–1.2)
Total Protein: 7.6 g/dL (ref 6.5–8.1)

## 2024-02-07 LAB — TROPONIN I (HIGH SENSITIVITY)
Troponin I (High Sensitivity): 3 ng/L (ref <18)
Troponin I (High Sensitivity): 3 ng/L (ref <18)

## 2024-02-07 NOTE — ED Provider Notes (Signed)
 [facility] Provider Note    Event Date/Time   First MD Initiated Contact with Patient 02/07/24 1129     (approximate)   History   Chest Pain   HPI  Danny Dixon is a 38 y.o. 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  Patient denies any shortness of breath, difficulty breathing, dizziness or chest pain at this time.  Patient was given nitroglycerin while in the ambulance which she states has given him a headache now.  He denies any previous cardiac history.  Patient denies any health issues but does admit to vaping frequently during the day and occasional use of alcohol.      Physical Exam   Triage Vital Signs: ED Triage Vitals  Encounter Vitals Group     BP 02/07/24 1104 (!) 141/67     Girls Systolic BP Percentile --      Girls Diastolic BP Percentile --      Boys Systolic BP Percentile --      Boys Diastolic BP Percentile --      Pulse Rate 02/07/24 1104 (!) 104     Resp 02/07/24 1104 18     Temp 02/07/24 1104 98.3 F (36.8 C)     Temp Source 02/07/24 1104 Oral     SpO2 02/07/24 1104 95 %     Weight --      Height --      Head Circumference --      Peak Flow --      Pain Score 02/07/24 1103 6     Pain Loc --      Pain Education --      Exclude from Growth Chart --     Most recent vital signs: Vitals:   02/07/24 1104 02/07/24 1354  BP: (!) 141/67 (!) 120/92  Pulse: (!) 104 90  Resp: 18 20  Temp: 98.3 F (36.8 C) 98.2 F (36.8 C)  SpO2: 95% 97%     General: Awake, no distress.  Alert, talkative, cooperative. CV:  Good peripheral perfusion.  Heart rate and rate rhythm. Resp:  Normal effort.  Lungs clear bilaterally. Abd:  No distention.  Soft, nontender.  Bowel sounds normoactive x 4 quadrants. Other:     ED Results / Procedures / Treatments   Labs (all labs ordered are listed, but only abnormal results  are displayed) Labs Reviewed  BASIC METABOLIC PANEL WITH GFR - Abnormal; Notable for the following components:      Result Value   Glucose, Bld 102 (*)    Calcium 8.8 (*)    All other components within normal limits  CBC  HEPATIC FUNCTION PANEL  TROPONIN I (HIGH SENSITIVITY)  TROPONIN I (HIGH SENSITIVITY)     EKG  Vent. rate 104 BPM PR interval 134 ms QRS duration 94 ms QT/QTcB 336/441 ms P-R-T axes 36 129 -9 Sinus tachycardia Left posterior fascicular block Inferior infarct , age undetermined Abnormal ECG No previous ECGs available   RADIOLOGY  Chest x-ray images reviewed by myself independent of the radiologist and was negative for acute cardiopulmonary changes.  Radiology reports mild bibasilar atelectasis.   PROCEDURES:  Critical Care performed:   Procedures   MEDICATIONS ORDERED IN ED: Medications - No data to display   IMPRESSION / MDM / ASSESSMENT AND PLAN / ED COURSE  I reviewed the triage vital signs  and the nursing notes.   Differential diagnosis includes, but is not limited to, chest pain, cardiac event, heat exposure, nonspecific chest pain.  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  Lab work is reassuring and patient was made aware that troponin x 2 was negative.  Chest x-ray did show some mild bibasilar atelectasis and we discussed his vaping habit.  EKG as noted above.  Currently patient does not have a PCP and we discussed the need to obtain a doctor for follow-up.  He was given a list of clinics in the area and also referral for continued care and PCP was made.  Patient is to return to the emergency department if any severe worsening of his symptoms or urgent concerns.    Clinical Course as of 02/07/24 1508  Mon Feb 07, 2024  1350 Troponin I (High Sensitivity): 3 [RS]    Clinical Course User Index [RS] Saunders Shona CROME, PA-C   Patient's presentation is  most consistent with acute complicated illness / injury requiring diagnostic workup.  FINAL CLINICAL IMPRESSION(S) / ED DIAGNOSES   Final diagnoses:  Nonspecific chest pain     Rx / DC Orders   ED Discharge Orders          Ordered    Ambulatory Referral to Primary Care (Establish Care)       Comments: Needs follow up and smoking cessation   02/07/24 1354             Note:  This document was prepared using Dragon voice recognition software and may include unintentional dictation errors.   Saunders Shona CROME, PA-C 02/07/24 1508    Arlander Charleston, MD 02/07/24 [phone]

## 2024-02-07 NOTE — Discharge Instructions (Addendum)
 You will need to call make appointment with one of the clinics listed on your discharge papers.  A referral has been placed for continued follow-up.  Return to the emergency department if any severe worsening of your chest pain.  Today's lab work was reassuring that this is not a cardiac reason for your chest pain.  Your lungs do show that you do need to stop vaping.  You may take Tylenol or ibuprofen as needed for chest wall pain.  Drink lots of fluids to stay hydrated especially if you are working out in the heat.  Please go to the following website to schedule new (and existing) patient appointments:   [email or web address]   The following is a list of primary care offices in the area who are accepting new patients at this time.  Please reach out to one of them directly and let them know you would like to schedule an appointment to follow up on an Emergency Department visit, and/or to establish a new primary care provider (PCP).  There are likely other primary care clinics in the are who are accepting new patients, but this is an excellent place to start:  [facility] Lead physician: Dr Jon Eva [address] [place], [place] [address] [phone]  [facility] Lead Physician: Dr Dorette Loron [address], [place], [place] [address] [phone]  [facility]  Lead Physician: Dr Duwaine Louder [address] [place], [place] [address] [phone]  [facility] Lead Physician: Dr Marolyn Officer [address], [place], [place] [address] [phone]  [facility] Lead Physician: Dr Leita Adie [address] [place], [place], [place] [address] [phone]

## 2024-02-07 NOTE — ED Triage Notes (Addendum)
 Pt to ED via [facility] from [facility] UC. Pt sent due to CP. Midline substernal CP started at 8am. No cardiac hx. Pt is everyday vapor  BP 160/100 HR 105 99% RA 20g LAC  324mg  ASA 1 spray nitroglycerin

## 2024-07-20 ENCOUNTER — Ambulatory Visit: Admitting: Nurse Practitioner

## 2024-07-20 VITALS — BP 134/88 | HR 73 | Temp 98.0°F | Ht 71.0 in | Wt 328.0 lb

## 2024-07-20 DIAGNOSIS — Z7689 Persons encountering health services in other specified circumstances: Secondary | ICD-10-CM

## 2024-07-20 DIAGNOSIS — Z6841 Body Mass Index (BMI) 40.0 and over, adult: Secondary | ICD-10-CM | POA: Diagnosis not present

## 2024-07-20 DIAGNOSIS — Z114 Encounter for screening for human immunodeficiency virus [HIV]: Secondary | ICD-10-CM | POA: Diagnosis not present

## 2024-07-20 DIAGNOSIS — Z131 Encounter for screening for diabetes mellitus: Secondary | ICD-10-CM | POA: Diagnosis not present

## 2024-07-20 DIAGNOSIS — Z1159 Encounter for screening for other viral diseases: Secondary | ICD-10-CM

## 2024-07-20 DIAGNOSIS — R0683 Snoring: Secondary | ICD-10-CM

## 2024-07-20 DIAGNOSIS — E66813 Obesity, class 3: Secondary | ICD-10-CM

## 2024-07-20 DIAGNOSIS — Z130 Encounter for screening for diseases of the blood and blood-forming organs and certain disorders involving the immune mechanism: Secondary | ICD-10-CM | POA: Diagnosis not present

## 2024-07-20 DIAGNOSIS — Z13220 Encounter for screening for lipoid disorders: Secondary | ICD-10-CM | POA: Diagnosis not present

## 2024-07-20 MED ORDER — ZEPBOUND 2.5 MG/0.5ML ~~LOC~~ SOAJ: 2.5000 mg | SUBCUTANEOUS | 0 refills | Status: AC

## 2024-07-20 NOTE — Progress Notes (Signed)
 "  BP 134/88   Pulse 73   Temp 98 F (36.7 C)   Ht 5' 11 (1.803 m)   Wt (!) 328 lb (148.8 kg)   SpO2 96%   BMI 45.75 kg/m    Subjective:    Patient ID: Danny Dixon, male    DOB: [birth year], 39 y.o.   MRN: [id number]  HPI: Danny Dixon is a 39 y.o. male  Chief Complaint  Patient presents with   Establish Care    Pt would like to discuss weight management.    Discussed the use of AI scribe software for clinical note transcription with the patient, who gave verbal consent to proceed.  History of Present Illness Danny Dixon is a 39 year old male who presents to establish care and discuss obesity.  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  Flowsheet Row Office Visit from 07/20/2024 in [facility]  1 48 inches   - Encourage continuation of lifestyle modifications, including dietary management and regular exercise. -continue to increase  physical activity, getting at least 150 min of physical activity a week.  Work on including runner, broadcasting/film/video 2 days a week.  - continue eating at a calorie deficit 2000-2200 cal a day, eating a well balanced diet with whole foods, avoiding processed foods.   Patient is motivated to continue working on lifestyle modification.       07/20/2024    9:11 AM 10/26/2019    3:33 PM  Depression screen PHQ 2/9  Decreased Interest 0 2  Down, Depressed, Hopeless 0 3  PHQ - 2 Score 0 5  Altered sleeping 0 3  Tired, decreased energy 0 2  Change in appetite 0 3  Feeling bad or failure about yourself  0 3  Trouble concentrating 0 1  Moving slowly or fidgety/restless 0 1  Suicidal thoughts 0 0  PHQ-9 Score 0 18   Difficult doing work/chores  Very difficult     Data saved with a previous flowsheet row definition    Relevant past medical, surgical, family and social history reviewed and updated as indicated. Interim medical history since our last visit reviewed. Allergies and medications reviewed and updated.  Review of Systems  Constitutional: Negative for fever or weight change.  Respiratory: Negative for cough and shortness of breath.   Cardiovascular: Negative for chest pain or palpitations.  Gastrointestinal: Negative for abdominal pain, no bowel changes.  Musculoskeletal: Negative for gait problem or joint swelling.  Skin: Negative  for rash.  Neurological: Negative for dizziness or headache.  No other specific complaints in a complete review of systems (except as listed in HPI above).      Objective:      BP 134/88   Pulse 73   Temp 98 F (36.7 C)   Ht 5' 11 (1.803 m)   Wt (!) 328 lb (148.8 kg)   SpO2 96%   BMI 45.75 kg/m    Wt Readings from Last 3 Encounters:  07/20/24 (!) 328 lb (148.8 kg)  06/19/22 299 lb 13.2 oz (136 kg)  10/18/21 299 lb 14.4 oz (136 kg)    Physical Exam VITALS: BP- 134/88 MEASUREMENTS: Weight- 328, BMI- 45.75. GENERAL: Alert, cooperative, well  developed, no acute distress. HEENT: Normocephalic, normal oropharynx, moist mucous membranes. CHEST: Clear to auscultation bilaterally, no wheezes, rhonchi, or crackles. CARDIOVASCULAR: Normal heart rate and rhythm, S1 and S2 normal without murmurs. ABDOMEN: Soft, non-tender, non-distended, without organomegaly, normal bowel sounds. EXTREMITIES: No cyanosis or edema. NEUROLOGICAL: Cranial nerves grossly intact, moves all extremities without gross motor or sensory deficit.  Results for orders placed or performed during the hospital encounter of 02/07/24  Hepatic function panel   Collection Time: 02/07/24 11:06 AM  Result Value Ref Range   Total Protein 7.6 6.5 - 8.1 g/dL   Albumin 4.0 3.5 - 5.0 g/dL   AST 27 15 - 41 U/L   ALT 39 0 - 44 U/L   Alkaline Phosphatase 79 38 - 126 U/L   Total Bilirubin 0.5 0.0 - 1.2 mg/dL   Bilirubin, Direct 0.1 0.0 - 0.2 mg/dL   Indirect Bilirubin 0.4 0.3 - 0.9 mg/dL  Basic metabolic panel   Collection Time: 02/07/24 11:08 AM  Result Value Ref Range   Sodium 139 135 - 145 mmol/L   Potassium 4.5 3.5 - 5.1 mmol/L   Chloride 106 98 - 111 mmol/L   CO2 22 22 - 32 mmol/L   Glucose, Bld 102 (H) 70 - 99 mg/dL   BUN 11 6 - 20 mg/dL   Creatinine, Ser 9.02 0.61 - 1.24 mg/dL   Calcium 8.8 (L) 8.9 - 10.3 mg/dL   GFR, Estimated >39 >39 mL/min   Anion gap 11 5 - 15  CBC   Collection Time: 02/07/24 11:08 AM  Result Value Ref Range   WBC 6.2 4.0 - 10.5 K/uL   RBC 5.39 4.22 - 5.81 MIL/uL   Hemoglobin 16.7 13.0 - 17.0 g/dL   HCT 52.5 60.9 - 47.9 %   MCV 87.9 80.0 - 100.0 fL   MCH 31.0 26.0 - 34.0 pg   MCHC 35.2 30.0 - 36.0 g/dL   RDW 86.9 88.4 - 84.4 %   Platelets 261 150 - 400 K/uL   nRBC 0.0 0.0 - 0.2 %  Troponin I (High Sensitivity)   Collection Time: 02/07/24 11:08 AM  Result Value Ref Range   Troponin I (High Sensitivity) 3 <18 ng/L  Troponin I (High Sensitivity)   Collection Time: 02/07/24  1:05 PM  Result Value Ref Range   Troponin I (High  Sensitivity) 3 <18 ng/L          Assessment & Plan:   Problem List Items Addressed This Visit       Other   Class 3 severe obesity due to excess calories without serious comorbidity with body mass index (BMI) of 45.0 to 49.9 in adult [facility]) - Primary   Relevant Medications   tirzepatide  (Danny Dixon ) 2.5 MG/0.5ML Pen   Other Relevant Orders  TSH   Ambulatory referral to Pulmonology   Other Visit Diagnoses       Encounter to establish care         Screening for deficiency anemia       Relevant Orders   CBC with Differential/Platelet     Screening for cholesterol level       Relevant Orders   Lipid panel     Screening for diabetes mellitus       Relevant Orders   Comprehensive metabolic panel with GFR   Hemoglobin A1c     Encounter for hepatitis C screening test for low risk patient       Relevant Orders   Hepatitis C antibody     Screening for HIV without presence of risk factors       Relevant Orders   HIV Antibody (routine testing w rflx)     Snoring       Relevant Orders   Ambulatory referral to Pulmonology        Assessment and Plan Assessment & Plan Class 3 obesity BMI of 45.75. Previous attempts at weight loss through high protein diets, keto diets, and exercise have been unsuccessful. Interested in GLP-1 receptor agonists [facility] and Danny Dixon ) for weight loss. Discussed potential side effects including nausea, vomiting, abdominal pain, constipation, diarrhea, and fatigue. No family history of thyroid cancer or pancreatitis, which are contraindications for GLP-1 receptor agonists. Discussed the importance of maintaining a calorie deficit and physical activity for effective weight loss. Insurance coverage for GLP-1 receptor agonists is uncertain, and oral Danny Dixon is an alternative option for cash pay patients. - Ordered routine labs to assess internal health status. - Submitted prior authorization for Danny Dixon . - If approved, will initiate Danny Dixon  with monthly  dose escalation. - If denied, will consider oral Wegovy as an alternative. - Scheduled follow-up every three months while on medication to monitor weight loss progress.  Suspected obstructive sleep apnea Reports snoring and frequent awakenings during the night, suggestive of obstructive sleep apnea. Discussed the potential health risks associated with untreated sleep apnea, including increased risk of heart attacks and strokes. Explained that untreated sleep apnea can lead to poor rest due to the body's panic mode from lack of oxygen, increasing health risks. - Ordered home sleep study through pulmonology to evaluate for obstructive sleep apnea.  General health maintenance Discussed lifestyle modifications including dietary changes and increased physical activity to aid in weight loss and overall health improvement. - Recommended lifestyle modifications including a calorie deficit diet (2000-2200 calories per day) and 150 minutes of physical activity per week, including strength training.        Follow up plan: Return in about 3 months (around 10/18/2024) for follow up. "

## 2024-07-21 ENCOUNTER — Other Ambulatory Visit: Payer: Self-pay | Admitting: Nurse Practitioner

## 2024-07-21 ENCOUNTER — Ambulatory Visit: Payer: Self-pay | Admitting: Nurse Practitioner

## 2024-07-21 DIAGNOSIS — E66813 Obesity, class 3: Secondary | ICD-10-CM

## 2024-07-21 LAB — COMPREHENSIVE METABOLIC PANEL WITH GFR
AG Ratio: 1.8 (calc) (ref 1.0–2.5)
ALT: 27 U/L (ref 9–46)
AST: 20 U/L (ref 10–40)
Albumin: 4.6 g/dL (ref 3.6–5.1)
Alkaline phosphatase (APISO): 89 U/L (ref 36–130)
BUN: 15 mg/dL (ref 7–25)
CO2: 28 mmol/L (ref 20–32)
Calcium: 9.6 mg/dL (ref 8.6–10.3)
Chloride: 103 mmol/L (ref 98–110)
Creat: 0.95 mg/dL (ref 0.60–1.26)
Globulin: 2.5 g/dL (ref 1.9–3.7)
Glucose, Bld: 88 mg/dL (ref 65–99)
Potassium: 4.7 mmol/L (ref 3.5–5.3)
Sodium: 138 mmol/L (ref 135–146)
Total Bilirubin: 1 mg/dL (ref 0.2–1.2)
Total Protein: 7.1 g/dL (ref 6.1–8.1)
eGFR: 104 mL/min/{1.73_m2}

## 2024-07-21 LAB — CBC WITH DIFFERENTIAL/PLATELET
Absolute Lymphocytes: 1776 {cells}/uL (ref 850–3900)
Absolute Monocytes: 360 {cells}/uL (ref 200–950)
Basophils Absolute: 21 {cells}/uL (ref 0–200)
Basophils Relative: 0.4 %
Eosinophils Absolute: 69 {cells}/uL (ref 15–500)
Eosinophils Relative: 1.3 %
HCT: 51.1 % (ref 39.4–51.1)
Hemoglobin: 17.3 g/dL — ABNORMAL HIGH (ref 13.2–17.1)
MCH: 30 pg (ref 27.0–33.0)
MCHC: 33.9 g/dL (ref 31.6–35.4)
MCV: 88.7 fL (ref 81.4–101.7)
MPV: 10 fL (ref 7.5–12.5)
Monocytes Relative: 6.8 %
Neutro Abs: 3074 {cells}/uL (ref 1500–7800)
Neutrophils Relative %: 58 %
Platelets: 276 10*3/uL (ref 140–400)
RBC: 5.76 Million/uL (ref 4.20–5.80)
RDW: 12.6 % (ref 11.0–15.0)
Total Lymphocyte: 33.5 %
WBC: 5.3 10*3/uL (ref 3.8–10.8)

## 2024-07-21 LAB — TSH: TSH: 1.01 m[IU]/L (ref 0.40–4.50)

## 2024-07-21 LAB — HEMOGLOBIN A1C
Hgb A1c MFr Bld: 5.4 %
Mean Plasma Glucose: 108 mg/dL
eAG (mmol/L): 6 mmol/L

## 2024-07-21 LAB — LIPID PANEL
Cholesterol: 193 mg/dL
HDL: 48 mg/dL
LDL Cholesterol (Calc): 119 mg/dL — ABNORMAL HIGH
Non-HDL Cholesterol (Calc): 145 mg/dL — ABNORMAL HIGH
Total CHOL/HDL Ratio: 4 (calc)
Triglycerides: 148 mg/dL

## 2024-07-21 LAB — HIV ANTIBODY (ROUTINE TESTING W REFLEX)
HIV 1&2 Ab, 4th Generation: NONREACTIVE
HIV FINAL INTERPRETATION: NEGATIVE

## 2024-07-21 LAB — HEPATITIS C ANTIBODY: Hepatitis C Ab: NONREACTIVE

## 2024-07-24 NOTE — Telephone Encounter (Signed)
 Requested medication (s) are due for refill today: Yes  Requested medication (s) are on the active medication list: Yes  Last refill:  07/20/24  Future visit scheduled: Yes  Notes to clinic:  Unable to refill per protocol, medication requires a Prior Authorization.      Requested Prescriptions  Pending Prescriptions Disp Refills   ZEPBOUND  2.5 MG/0.5ML Pen [Pharmacy Med Name: ZEPBOUND  2.5 MG/0.5 ML PEN]  0    Sig: INJECT 2.5 MG SUBCUTANEOUSLY WEEKLY     Off-Protocol Failed - 07/24/2024  1:29 PM      Failed - Medication not assigned to a protocol, review manually.      Passed - Valid encounter within last 12 months    Recent Outpatient Visits           4 days ago Class 3 severe obesity due to excess calories without serious comorbidity with body mass index (BMI) of 45.0 to 49.9 in adult [facility])   [facility] Gareth Mliss [place], [place]

## 2024-07-26 ENCOUNTER — Other Ambulatory Visit (HOSPITAL_COMMUNITY): Payer: Self-pay

## 2024-07-26 ENCOUNTER — Telehealth: Payer: Self-pay | Admitting: Pharmacy Technician

## 2024-07-26 NOTE — Telephone Encounter (Signed)
 PA request has been Received. New Encounter has been or will be created for follow up. For additional info see Pharmacy Prior Auth telephone encounter from 07/26/24.

## 2024-07-26 NOTE — Telephone Encounter (Signed)
 Pharmacy Patient Advocate Encounter   Received notification from RX Request Messages that prior authorization for Zepbound  2.5 mg/0.73ml pen is required/requested.   Insurance verification completed.   The patient is insured through [facility].   Per test claim: Per test claim, medication is not covered due to plan/benefit exclusion, PA not submitted at this time  **There is an exception if the patient has OSA then we can submit a PA but we will need a copy of his sleep study showing his AHI is 15 or higher**

## 2024-10-19 ENCOUNTER — Ambulatory Visit: Admitting: Nurse Practitioner
